# Patient Record
Sex: Female | Born: 1950
Health system: Southern US, Community
[De-identification: ages and names within clinical notes are randomized; demographics above are authoritative.]

## PROBLEM LIST (undated history)

## (undated) DIAGNOSIS — M1990 Unspecified osteoarthritis, unspecified site: Secondary | ICD-10-CM

## (undated) DIAGNOSIS — E039 Hypothyroidism, unspecified: Secondary | ICD-10-CM

## (undated) DIAGNOSIS — E119 Type 2 diabetes mellitus without complications: Secondary | ICD-10-CM

## (undated) HISTORY — DX: Type 2 diabetes mellitus without complications: E11.9

## (undated) HISTORY — DX: Hypothyroidism, unspecified: E03.9

## (undated) HISTORY — PX: WRIST SURGERY: SHX841

## (undated) HISTORY — PX: CHOLECYSTECTOMY: SHX55

---

## 2000-02-26 ENCOUNTER — Encounter: Payer: Self-pay | Admitting: Gastroenterology

## 2000-02-26 ENCOUNTER — Ambulatory Visit (HOSPITAL_COMMUNITY): Admission: RE | Admit: 2000-02-26 | Discharge: 2000-02-26 | Payer: Self-pay

## 2000-09-11 ENCOUNTER — Ambulatory Visit (HOSPITAL_COMMUNITY): Admission: RE | Admit: 2000-09-11 | Discharge: 2000-09-11 | Payer: Self-pay | Admitting: Gastroenterology

## 2000-10-24 ENCOUNTER — Encounter: Payer: Self-pay | Admitting: Family Medicine

## 2000-10-24 ENCOUNTER — Encounter: Admission: RE | Admit: 2000-10-24 | Discharge: 2000-10-24 | Payer: Self-pay | Admitting: Family Medicine

## 2008-06-18 ENCOUNTER — Encounter: Admission: RE | Admit: 2008-06-18 | Discharge: 2008-06-18 | Payer: Self-pay | Admitting: Neurology

## 2008-07-19 ENCOUNTER — Encounter: Admission: RE | Admit: 2008-07-19 | Discharge: 2008-08-23 | Payer: Self-pay | Admitting: Neurology

## 2008-08-31 ENCOUNTER — Ambulatory Visit: Payer: Self-pay | Admitting: Psychology

## 2008-08-31 ENCOUNTER — Encounter: Admission: RE | Admit: 2008-08-31 | Discharge: 2008-09-02 | Payer: Self-pay | Admitting: Neurology

## 2011-01-12 NOTE — Procedures (Signed)
[facility]  Patient:    Debra Burnett, Debra Burnett                       MRN: [id number] Proc. Date: 09/11/00 Adm. Date:  [id number] Disc. Date: [id number] Attending:  Louie Bun                           Procedure Report  PROCEDURE:  Flexible sigmoidoscopy.  INDICATION FOR PROCEDURE:  Screening for colon cancer in a 60 year old patient with no prior colon screening.  DESCRIPTION OF PROCEDURE:  The patient was placed in the left lateral decubitus position and placed on the pulse monitor with continuous low-flow oxygen delivered by nasal cannula.  She had been sedated with 60 mg IV Demerol and 6 mg IV Versed for the previous EGD, and no further sedation was required for this procedure.  The Olympus video colonoscope was inserted into the rectum and advanced to about 30-40 cm.  The prep was inconsistent, with the distal 20 cm being well-visualized.  There were areas between 20 and 40 cm that were coated with some adherent stool, and I could not rule out small lesions less than 1 cm in all areas.  Otherwise, the visualized areas of the descending as well as the sigmoid and rectum appeared normal down to the anus, where small internal hemorrhoids were seen.  The scope was then withdrawn and the patient returned to the recovery room in stable condition.  She tolerated the procedure well, and there were no immediate complications.  IMPRESSION:  Internal hemorrhoids, otherwise normal sigmoidoscopy. DD:  09/11/00 TD:  09/13/00 Job: [id number]

## 2016-03-22 DIAGNOSIS — M549 Dorsalgia, unspecified: Secondary | ICD-10-CM | POA: Diagnosis not present

## 2016-04-12 DIAGNOSIS — H40033 Anatomical narrow angle, bilateral: Secondary | ICD-10-CM | POA: Diagnosis not present

## 2016-04-12 DIAGNOSIS — E119 Type 2 diabetes mellitus without complications: Secondary | ICD-10-CM | POA: Diagnosis not present

## 2016-05-14 ENCOUNTER — Other Ambulatory Visit: Payer: Self-pay | Admitting: Physician Assistant

## 2016-05-14 DIAGNOSIS — E119 Type 2 diabetes mellitus without complications: Secondary | ICD-10-CM | POA: Diagnosis not present

## 2016-05-14 DIAGNOSIS — E038 Other specified hypothyroidism: Secondary | ICD-10-CM | POA: Diagnosis not present

## 2016-05-14 DIAGNOSIS — E039 Hypothyroidism, unspecified: Secondary | ICD-10-CM | POA: Diagnosis not present

## 2016-05-14 DIAGNOSIS — R0989 Other specified symptoms and signs involving the circulatory and respiratory systems: Secondary | ICD-10-CM | POA: Diagnosis not present

## 2016-05-14 DIAGNOSIS — E785 Hyperlipidemia, unspecified: Secondary | ICD-10-CM | POA: Diagnosis not present

## 2016-05-18 DIAGNOSIS — E039 Hypothyroidism, unspecified: Secondary | ICD-10-CM | POA: Diagnosis not present

## 2016-05-21 ENCOUNTER — Ambulatory Visit
Admission: RE | Admit: 2016-05-21 | Discharge: 2016-05-21 | Disposition: A | Discharge status: Home / Self Care | Payer: PPO | Source: Ambulatory Visit | Attending: Physician Assistant | Admitting: Physician Assistant

## 2016-05-21 DIAGNOSIS — R0989 Other specified symptoms and signs involving the circulatory and respiratory systems: Secondary | ICD-10-CM

## 2016-05-21 DIAGNOSIS — I6523 Occlusion and stenosis of bilateral carotid arteries: Secondary | ICD-10-CM | POA: Diagnosis not present

## 2016-07-31 DIAGNOSIS — Z23 Encounter for immunization: Secondary | ICD-10-CM | POA: Diagnosis not present

## 2016-07-31 DIAGNOSIS — E785 Hyperlipidemia, unspecified: Secondary | ICD-10-CM | POA: Diagnosis not present

## 2016-07-31 DIAGNOSIS — F419 Anxiety disorder, unspecified: Secondary | ICD-10-CM | POA: Diagnosis not present

## 2016-07-31 DIAGNOSIS — F17200 Nicotine dependence, unspecified, uncomplicated: Secondary | ICD-10-CM | POA: Diagnosis not present

## 2016-07-31 DIAGNOSIS — H6123 Impacted cerumen, bilateral: Secondary | ICD-10-CM | POA: Diagnosis not present

## 2016-07-31 DIAGNOSIS — E038 Other specified hypothyroidism: Secondary | ICD-10-CM | POA: Diagnosis not present

## 2016-07-31 DIAGNOSIS — E119 Type 2 diabetes mellitus without complications: Secondary | ICD-10-CM | POA: Diagnosis not present

## 2016-08-02 DIAGNOSIS — H938X2 Other specified disorders of left ear: Secondary | ICD-10-CM | POA: Diagnosis not present

## 2017-02-06 DIAGNOSIS — E039 Hypothyroidism, unspecified: Secondary | ICD-10-CM | POA: Diagnosis not present

## 2017-02-06 DIAGNOSIS — E7800 Pure hypercholesterolemia, unspecified: Secondary | ICD-10-CM | POA: Diagnosis not present

## 2017-02-06 DIAGNOSIS — F419 Anxiety disorder, unspecified: Secondary | ICD-10-CM | POA: Diagnosis not present

## 2017-02-06 DIAGNOSIS — E119 Type 2 diabetes mellitus without complications: Secondary | ICD-10-CM | POA: Diagnosis not present

## 2017-07-22 ENCOUNTER — Other Ambulatory Visit: Payer: Self-pay | Admitting: Physician Assistant

## 2017-07-22 DIAGNOSIS — R0989 Other specified symptoms and signs involving the circulatory and respiratory systems: Secondary | ICD-10-CM

## 2017-07-22 DIAGNOSIS — E039 Hypothyroidism, unspecified: Secondary | ICD-10-CM | POA: Diagnosis not present

## 2017-07-22 DIAGNOSIS — E7800 Pure hypercholesterolemia, unspecified: Secondary | ICD-10-CM | POA: Diagnosis not present

## 2017-07-22 DIAGNOSIS — E119 Type 2 diabetes mellitus without complications: Secondary | ICD-10-CM | POA: Diagnosis not present

## 2017-07-26 ENCOUNTER — Other Ambulatory Visit: Payer: PPO

## 2017-07-29 ENCOUNTER — Ambulatory Visit
Admission: RE | Admit: 2017-07-29 | Discharge: 2017-07-29 | Disposition: A | Discharge status: Home / Self Care | Payer: PPO | Source: Ambulatory Visit | Attending: Physician Assistant | Admitting: Physician Assistant

## 2017-07-29 DIAGNOSIS — I6523 Occlusion and stenosis of bilateral carotid arteries: Secondary | ICD-10-CM | POA: Diagnosis not present

## 2017-07-29 DIAGNOSIS — R0989 Other specified symptoms and signs involving the circulatory and respiratory systems: Secondary | ICD-10-CM

## 2018-03-10 DIAGNOSIS — F17200 Nicotine dependence, unspecified, uncomplicated: Secondary | ICD-10-CM | POA: Diagnosis not present

## 2018-03-10 DIAGNOSIS — E7800 Pure hypercholesterolemia, unspecified: Secondary | ICD-10-CM | POA: Diagnosis not present

## 2018-03-10 DIAGNOSIS — Z0000 Encounter for general adult medical examination without abnormal findings: Secondary | ICD-10-CM | POA: Diagnosis not present

## 2018-03-10 DIAGNOSIS — E039 Hypothyroidism, unspecified: Secondary | ICD-10-CM | POA: Diagnosis not present

## 2018-03-10 DIAGNOSIS — E119 Type 2 diabetes mellitus without complications: Secondary | ICD-10-CM | POA: Diagnosis not present

## 2018-03-10 DIAGNOSIS — F419 Anxiety disorder, unspecified: Secondary | ICD-10-CM | POA: Diagnosis not present

## 2018-09-19 DIAGNOSIS — F17200 Nicotine dependence, unspecified, uncomplicated: Secondary | ICD-10-CM | POA: Diagnosis not present

## 2018-09-19 DIAGNOSIS — E119 Type 2 diabetes mellitus without complications: Secondary | ICD-10-CM | POA: Diagnosis not present

## 2018-09-19 DIAGNOSIS — E039 Hypothyroidism, unspecified: Secondary | ICD-10-CM | POA: Diagnosis not present

## 2018-09-19 DIAGNOSIS — E7800 Pure hypercholesterolemia, unspecified: Secondary | ICD-10-CM | POA: Diagnosis not present

## 2019-05-18 DIAGNOSIS — E039 Hypothyroidism, unspecified: Secondary | ICD-10-CM | POA: Diagnosis not present

## 2019-05-18 DIAGNOSIS — E119 Type 2 diabetes mellitus without complications: Secondary | ICD-10-CM | POA: Diagnosis not present

## 2019-05-18 DIAGNOSIS — F419 Anxiety disorder, unspecified: Secondary | ICD-10-CM | POA: Diagnosis not present

## 2019-05-18 DIAGNOSIS — E7800 Pure hypercholesterolemia, unspecified: Secondary | ICD-10-CM | POA: Diagnosis not present

## 2019-05-18 DIAGNOSIS — Z0000 Encounter for general adult medical examination without abnormal findings: Secondary | ICD-10-CM | POA: Diagnosis not present

## 2019-11-23 ENCOUNTER — Other Ambulatory Visit (HOSPITAL_COMMUNITY): Payer: Self-pay | Admitting: Physician Assistant

## 2019-11-23 DIAGNOSIS — E7800 Pure hypercholesterolemia, unspecified: Secondary | ICD-10-CM | POA: Diagnosis not present

## 2019-11-23 DIAGNOSIS — F419 Anxiety disorder, unspecified: Secondary | ICD-10-CM | POA: Diagnosis not present

## 2019-11-23 DIAGNOSIS — E119 Type 2 diabetes mellitus without complications: Secondary | ICD-10-CM | POA: Diagnosis not present

## 2019-11-23 DIAGNOSIS — M8580 Other specified disorders of bone density and structure, unspecified site: Secondary | ICD-10-CM | POA: Diagnosis not present

## 2019-11-23 DIAGNOSIS — F17200 Nicotine dependence, unspecified, uncomplicated: Secondary | ICD-10-CM | POA: Diagnosis not present

## 2019-11-23 DIAGNOSIS — E039 Hypothyroidism, unspecified: Secondary | ICD-10-CM | POA: Diagnosis not present

## 2019-11-23 DIAGNOSIS — Z1231 Encounter for screening mammogram for malignant neoplasm of breast: Secondary | ICD-10-CM

## 2019-11-23 DIAGNOSIS — E2839 Other primary ovarian failure: Secondary | ICD-10-CM

## 2019-12-23 ENCOUNTER — Ambulatory Visit (HOSPITAL_COMMUNITY)
Admission: RE | Admit: 2019-12-23 | Discharge: 2019-12-23 | Disposition: A | Discharge status: Home / Self Care | Payer: PPO | Source: Ambulatory Visit | Attending: Physician Assistant | Admitting: Physician Assistant

## 2019-12-23 ENCOUNTER — Other Ambulatory Visit: Payer: Self-pay

## 2019-12-23 DIAGNOSIS — E2839 Other primary ovarian failure: Secondary | ICD-10-CM | POA: Insufficient documentation

## 2019-12-23 DIAGNOSIS — Z1231 Encounter for screening mammogram for malignant neoplasm of breast: Secondary | ICD-10-CM | POA: Diagnosis not present

## 2019-12-23 DIAGNOSIS — Z780 Asymptomatic menopausal state: Secondary | ICD-10-CM | POA: Diagnosis not present

## 2019-12-23 DIAGNOSIS — M85852 Other specified disorders of bone density and structure, left thigh: Secondary | ICD-10-CM | POA: Diagnosis not present

## 2019-12-24 ENCOUNTER — Other Ambulatory Visit (HOSPITAL_COMMUNITY): Payer: Self-pay | Admitting: Physician Assistant

## 2019-12-28 ENCOUNTER — Other Ambulatory Visit (HOSPITAL_COMMUNITY): Payer: Self-pay | Admitting: Physician Assistant

## 2019-12-28 DIAGNOSIS — R928 Other abnormal and inconclusive findings on diagnostic imaging of breast: Secondary | ICD-10-CM

## 2020-01-05 ENCOUNTER — Other Ambulatory Visit: Payer: Self-pay

## 2020-01-05 ENCOUNTER — Ambulatory Visit (HOSPITAL_COMMUNITY)
Admission: RE | Admit: 2020-01-05 | Discharge: 2020-01-05 | Disposition: A | Discharge status: Home / Self Care | Payer: PPO | Source: Ambulatory Visit | Attending: Physician Assistant | Admitting: Physician Assistant

## 2020-01-05 DIAGNOSIS — R928 Other abnormal and inconclusive findings on diagnostic imaging of breast: Secondary | ICD-10-CM | POA: Diagnosis not present

## 2020-01-05 DIAGNOSIS — R921 Mammographic calcification found on diagnostic imaging of breast: Secondary | ICD-10-CM | POA: Diagnosis not present

## 2020-01-05 DIAGNOSIS — N6489 Other specified disorders of breast: Secondary | ICD-10-CM | POA: Diagnosis not present

## 2020-01-21 ENCOUNTER — Other Ambulatory Visit: Payer: Self-pay | Admitting: Physician Assistant

## 2020-01-21 DIAGNOSIS — R921 Mammographic calcification found on diagnostic imaging of breast: Secondary | ICD-10-CM

## 2020-02-01 ENCOUNTER — Ambulatory Visit
Admission: RE | Admit: 2020-02-01 | Discharge: 2020-02-01 | Disposition: A | Discharge status: Home / Self Care | Payer: PPO | Source: Ambulatory Visit | Attending: Physician Assistant | Admitting: Physician Assistant

## 2020-02-01 ENCOUNTER — Other Ambulatory Visit: Payer: Self-pay

## 2020-02-01 ENCOUNTER — Inpatient Hospital Stay: Admission: RE | Admit: 2020-02-01 | Payer: PPO | Source: Ambulatory Visit

## 2020-02-01 DIAGNOSIS — R921 Mammographic calcification found on diagnostic imaging of breast: Secondary | ICD-10-CM

## 2020-02-01 DIAGNOSIS — N6011 Diffuse cystic mastopathy of right breast: Secondary | ICD-10-CM | POA: Diagnosis not present

## 2020-02-25 DIAGNOSIS — Z0184 Encounter for antibody response examination: Secondary | ICD-10-CM | POA: Diagnosis not present

## 2020-06-10 DIAGNOSIS — Z0000 Encounter for general adult medical examination without abnormal findings: Secondary | ICD-10-CM | POA: Diagnosis not present

## 2020-06-10 DIAGNOSIS — F5102 Adjustment insomnia: Secondary | ICD-10-CM | POA: Diagnosis not present

## 2020-06-10 DIAGNOSIS — Z23 Encounter for immunization: Secondary | ICD-10-CM | POA: Diagnosis not present

## 2020-06-10 DIAGNOSIS — E7800 Pure hypercholesterolemia, unspecified: Secondary | ICD-10-CM | POA: Diagnosis not present

## 2020-06-10 DIAGNOSIS — Z1159 Encounter for screening for other viral diseases: Secondary | ICD-10-CM | POA: Diagnosis not present

## 2020-06-10 DIAGNOSIS — E119 Type 2 diabetes mellitus without complications: Secondary | ICD-10-CM | POA: Diagnosis not present

## 2020-06-10 DIAGNOSIS — Z7984 Long term (current) use of oral hypoglycemic drugs: Secondary | ICD-10-CM | POA: Diagnosis not present

## 2020-06-10 DIAGNOSIS — E039 Hypothyroidism, unspecified: Secondary | ICD-10-CM | POA: Diagnosis not present

## 2020-06-27 DIAGNOSIS — Z1211 Encounter for screening for malignant neoplasm of colon: Secondary | ICD-10-CM | POA: Diagnosis not present

## 2020-06-27 DIAGNOSIS — Z1212 Encounter for screening for malignant neoplasm of rectum: Secondary | ICD-10-CM | POA: Diagnosis not present

## 2020-07-06 LAB — COLOGUARD: COLOGUARD: POSITIVE — AB

## 2021-02-20 DIAGNOSIS — F5102 Adjustment insomnia: Secondary | ICD-10-CM | POA: Diagnosis not present

## 2021-02-20 DIAGNOSIS — E119 Type 2 diabetes mellitus without complications: Secondary | ICD-10-CM | POA: Diagnosis not present

## 2021-02-20 DIAGNOSIS — F419 Anxiety disorder, unspecified: Secondary | ICD-10-CM | POA: Diagnosis not present

## 2021-02-20 DIAGNOSIS — E039 Hypothyroidism, unspecified: Secondary | ICD-10-CM | POA: Diagnosis not present

## 2021-02-20 DIAGNOSIS — R195 Other fecal abnormalities: Secondary | ICD-10-CM | POA: Diagnosis not present

## 2021-02-20 DIAGNOSIS — E7800 Pure hypercholesterolemia, unspecified: Secondary | ICD-10-CM | POA: Diagnosis not present

## 2021-02-20 DIAGNOSIS — Z23 Encounter for immunization: Secondary | ICD-10-CM | POA: Diagnosis not present

## 2021-07-04 ENCOUNTER — Encounter: Payer: Self-pay | Admitting: Internal Medicine

## 2021-08-07 ENCOUNTER — Other Ambulatory Visit: Payer: Self-pay | Admitting: Physician Assistant

## 2021-08-07 DIAGNOSIS — E039 Hypothyroidism, unspecified: Secondary | ICD-10-CM | POA: Diagnosis not present

## 2021-08-07 DIAGNOSIS — E7800 Pure hypercholesterolemia, unspecified: Secondary | ICD-10-CM | POA: Diagnosis not present

## 2021-08-07 DIAGNOSIS — E119 Type 2 diabetes mellitus without complications: Secondary | ICD-10-CM | POA: Diagnosis not present

## 2021-08-07 DIAGNOSIS — Z1231 Encounter for screening mammogram for malignant neoplasm of breast: Secondary | ICD-10-CM

## 2021-08-07 DIAGNOSIS — F419 Anxiety disorder, unspecified: Secondary | ICD-10-CM | POA: Diagnosis not present

## 2021-08-07 DIAGNOSIS — Z0000 Encounter for general adult medical examination without abnormal findings: Secondary | ICD-10-CM | POA: Diagnosis not present

## 2021-08-07 DIAGNOSIS — Z23 Encounter for immunization: Secondary | ICD-10-CM | POA: Diagnosis not present

## 2021-09-19 DIAGNOSIS — E039 Hypothyroidism, unspecified: Secondary | ICD-10-CM | POA: Diagnosis not present

## 2021-10-24 DIAGNOSIS — Z1231 Encounter for screening mammogram for malignant neoplasm of breast: Secondary | ICD-10-CM | POA: Diagnosis not present

## 2021-10-25 ENCOUNTER — Inpatient Hospital Stay: Admission: RE | Admit: 2021-10-25 | Payer: PPO | Source: Ambulatory Visit

## 2021-10-30 DIAGNOSIS — E039 Hypothyroidism, unspecified: Secondary | ICD-10-CM | POA: Diagnosis not present

## 2021-11-08 IMAGING — MG MM DIGITAL DIAGNOSTIC UNILAT*R* W/ TOMO W/ CAD
6 series · 6 of 14 positions shown · non-contrast
Comparison: Previous exam(s).

CLINICAL DATA: 69-year-old female presenting as a recall from
baseline screening for possible right breast asymmetry and
calcifications.

EXAM:
DIGITAL DIAGNOSTIC RIGHT MAMMOGRAM WITH TOMO
ULTRASOUND RIGHT BREAST

[R ML]
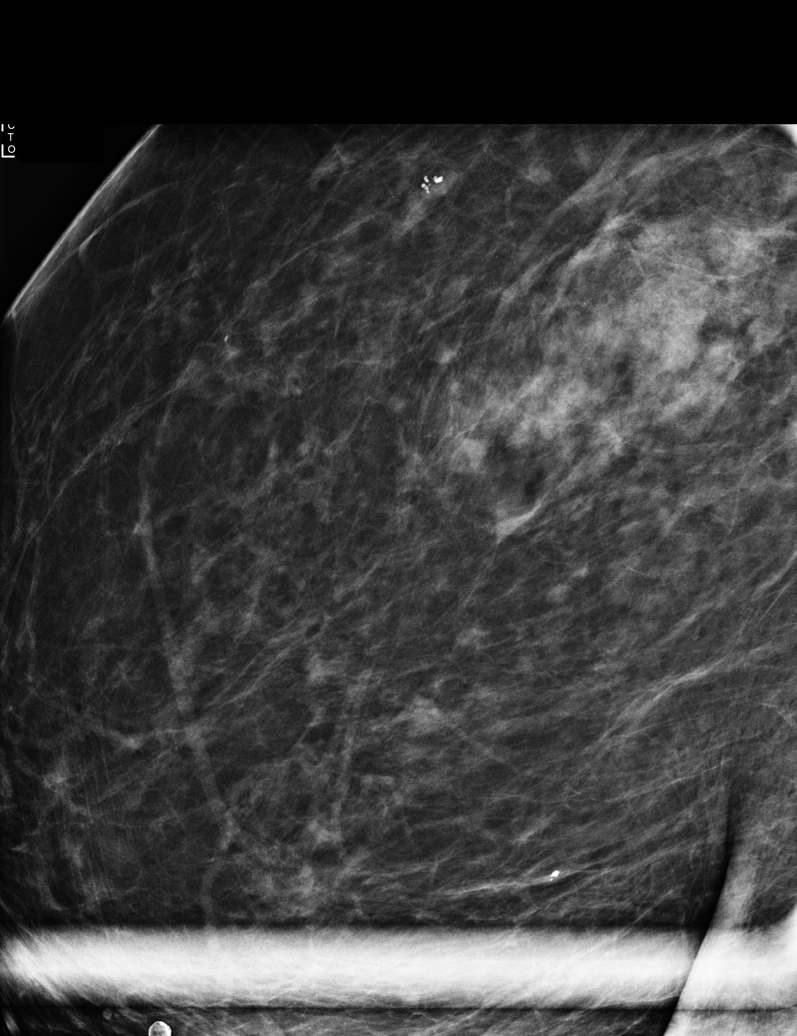

[R CC]
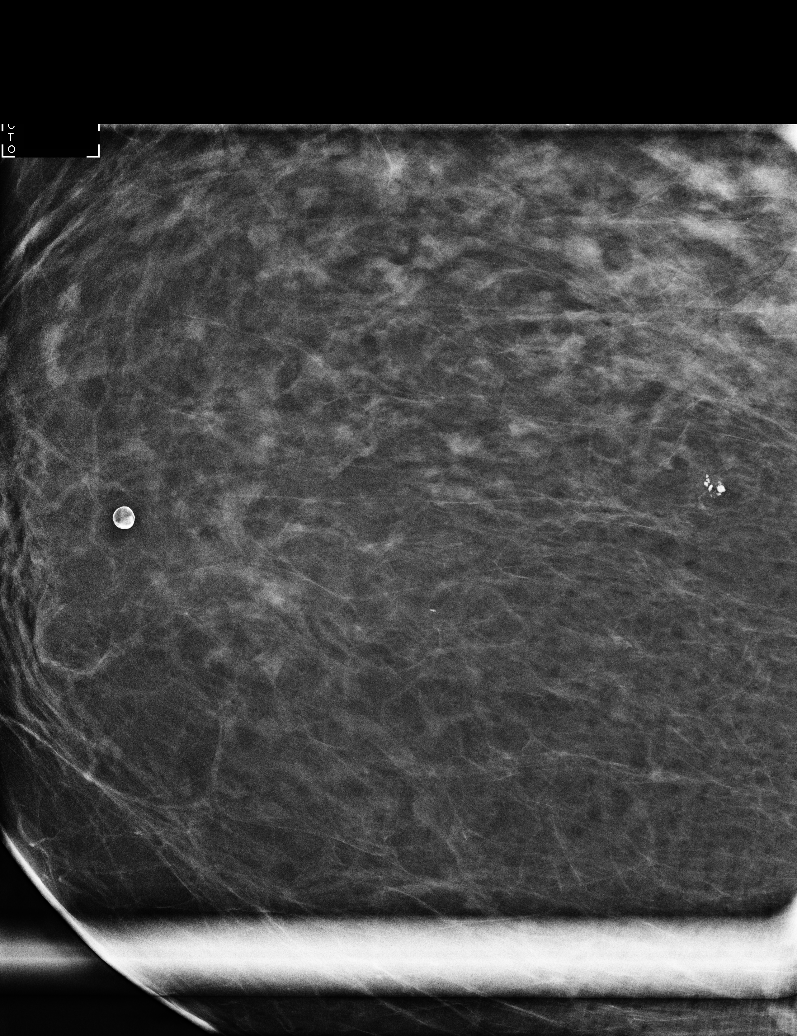

[R MLO synth-2D]
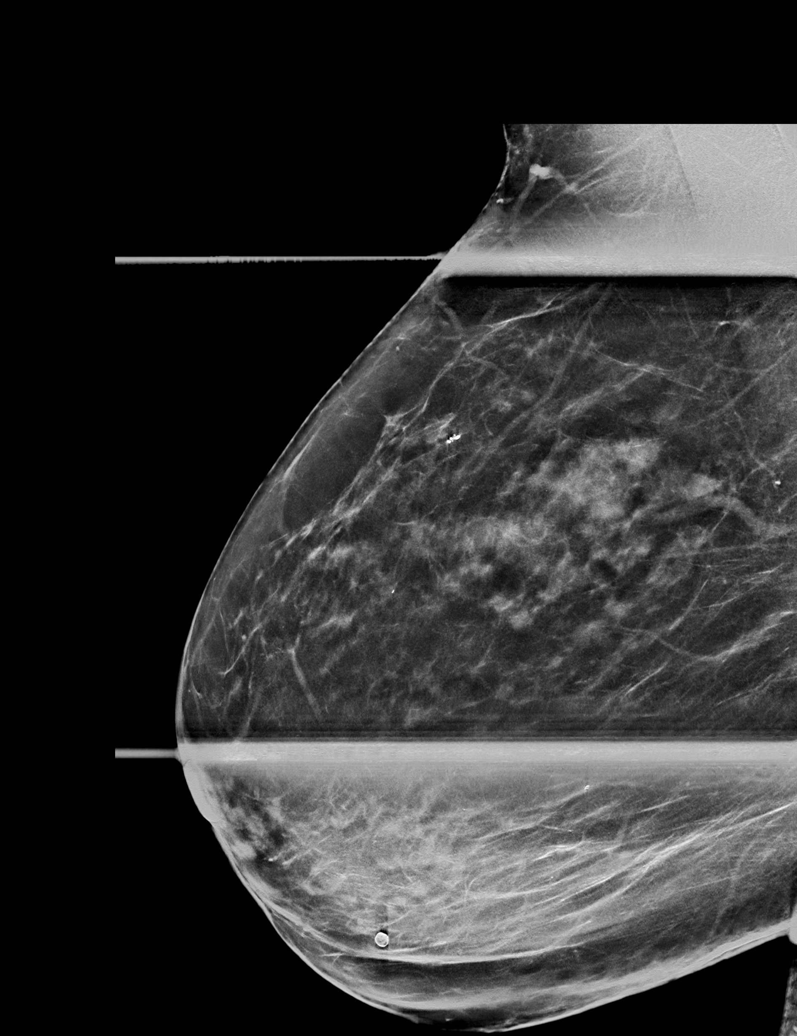

[R CC synth-2D]
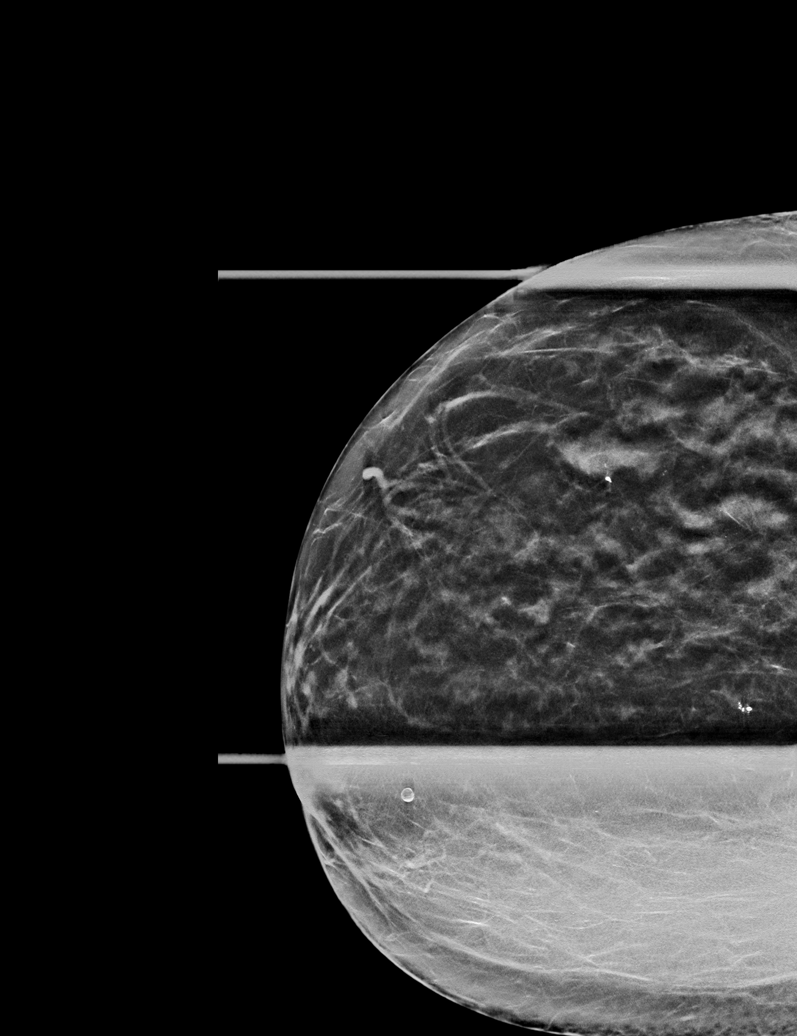

[R MLO tomo · tomo slice 29/57.0]
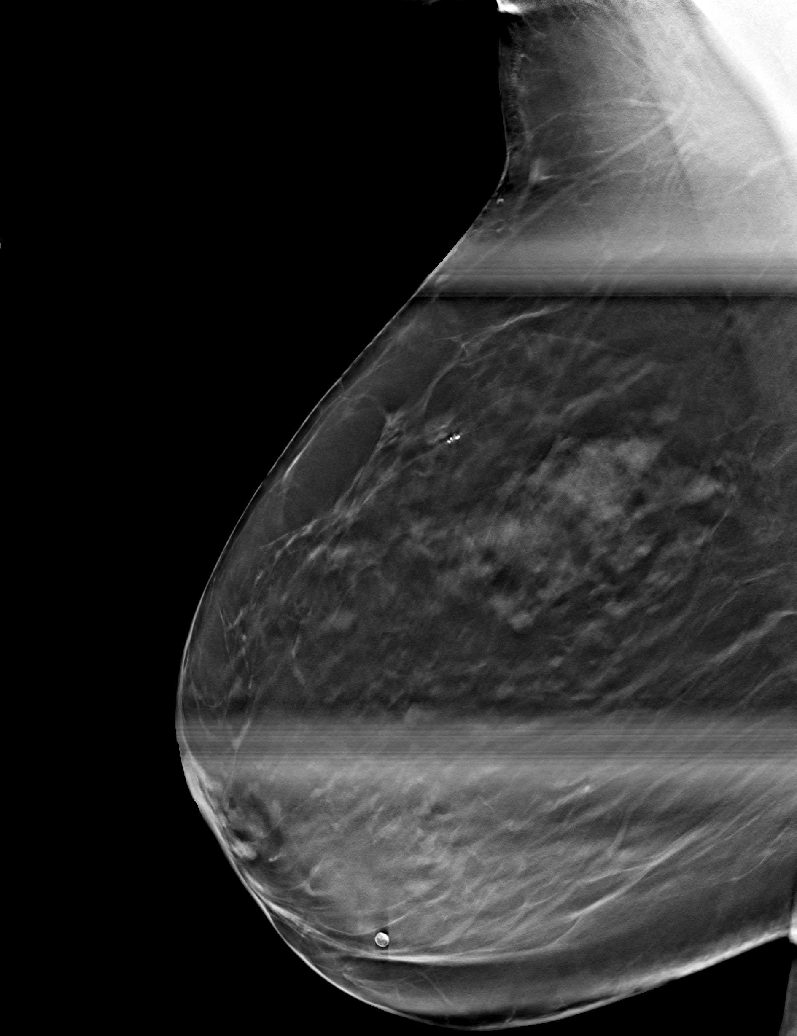

[R CC tomo · tomo slice 24/47.0]
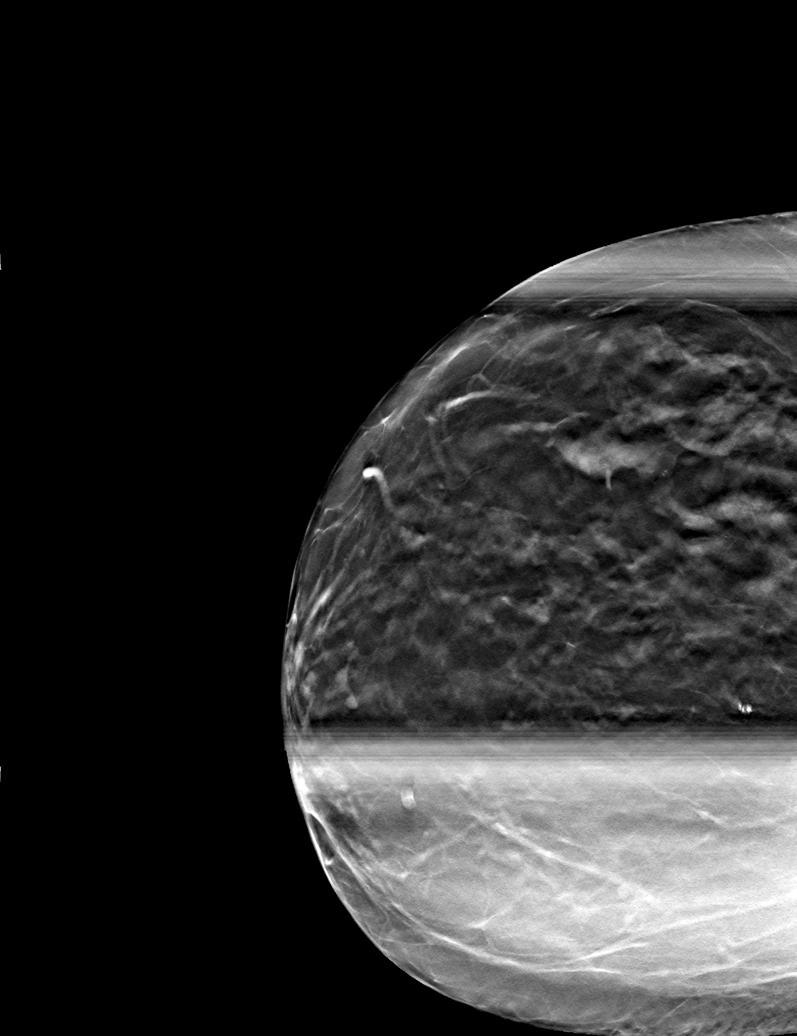

[6 of 14 positions shown; findings below may reference images not displayed]

ACR Breast Density Category c: The breast tissue is heterogeneously
dense, which may obscure small masses.
FINDINGS: Mammogram:

Spot magnification 2D and spot compression tomosynthesis views of
the right breast were performed. There is a persistent regional
asymmetry in the upper-outer quadrant of the right breast without a
discrete suspicious mass on the additional views. The appearance
mammographically favors overlapping normal fibroglandular tissue.
There is a group of coarse heterogeneous calcifications spanning 3
mm in the superior central right breast.

Ultrasound:

Targeted ultrasound is performed throughout the upper-outer quadrant
the right breast demonstrating no suspicious cystic or solid mass or
other finding to correspond to the asymmetry seen mammographically.
IMPRESSION: 1. Grouped coarse heterogeneous calcifications spanning 3 mm in the
superior right breast are indeterminate.

2. The regional asymmetry in the upper outer right breast is
consistent with benign dense fibroglandular tissue.

RECOMMENDATION:
Stereotactic core needle biopsy of the right breast calcifications.

The patient will be contacted to arrange for the biopsy appointment
in [HOSPITAL].

BI-RADS CATEGORY  4: Suspicious.

## 2021-11-14 NOTE — H&P (View-Only) (Signed)
? ? ?GI Office Note   ? ?Referring Provider: Williams, Breejante J, * ?Primary Care Physician:  Williams, Breejante J, PA-C  ? ? ?Chief Complaint  ? ?Chief Complaint  ?Patient presents with  ? Constipation  ?  Positive cologuard   ? ? ? ?History of Present Illness  ? ?Debra Burnett is a 71 y.o. female with history of hypothyroidism and diabetes presenting today at the request of her PCP for need for colonoscopy due to positive Cologuard. ? ?Last colonoscopy 2002 - internal hemorrhoids, otherwise normal. Per Care everywhere - reported colonoscopy in October 2011 but not reports or information available ? ? ?Today: ?She reports a recent positive Cologuard test. She has had multiple colonoscopies in the past but is not clear on dates and feels like they were normal. She does report some toilet tissue hematochezia secondary to hemorrhoids and intermittent constipation. Her bowel habits are that she usually goes everyday unless she skips her fiber in her diet. She drinks a fair amount of decaf coffee and only has about 3 small glasses of water a day. Stool consistency usually Bristol 2 or 3, denies straining or incomplete emptying. She reports he has tried metamucil which is somewhat helpful. Denies melena, dysphagia, N/V, abdominal pain, unexplained weight loss, reflux. She is unaware of family history of colon cancer or colon polyps. ? ?HOH - needs hearing aids, states she is going to follow up with her PCP about this.  ? ?Takes valium once a week for insomnia. ? ?Past Medical History:  ?Diagnosis Date  ? Diabetes type 2, controlled (HCC)   ? Hypothyroid   ? ? ?Past Surgical History:  ?Procedure Laterality Date  ? CHOLECYSTECTOMY    ? 1970s  ? ? ?Current Outpatient Medications  ?Medication Sig Dispense Refill  ? atorvastatin (LIPITOR) 40 MG tablet Take 40 mg by mouth daily.    ? Coenzyme Q10 100 MG capsule Take by mouth.    ? Cyanocobalamin (VITAMIN B 12) 100 MCG LOZG Take by mouth.    ? levothyroxine (SYNTHROID)  50 MCG tablet Take by mouth.    ? liothyronine (CYTOMEL) 5 MCG tablet Take 5 mcg by mouth daily.    ? Melatonin 5 MG CAPS Take by mouth.    ? metFORMIN (GLUCOPHAGE) 500 MG tablet Take 1 tablet by mouth daily.    ? metFORMIN (GLUCOPHAGE) 500 MG tablet Take 500 mg by mouth daily.    ? MODERNA COVID-19 BIVAL BOOSTER 50 MCG/0.5ML injection     ? Multiple Vitamin (MULTI-VITAMIN) tablet Take by mouth.    ? diazepam (VALIUM) 5 MG tablet Take 2.5-5 mg by mouth every 6 (six) hours as needed.    ? ?No current facility-administered medications for this visit.  ? ? ?Allergies as of 11/15/2021  ? (Not on File)  ? ? ?Family History  ?Problem Relation Age of Onset  ? Heart disease Father   ? Diverticulitis Father   ? Cancer - Colon Neg Hx   ? Colon polyps Neg Hx   ? ? ?Social History  ? ?Socioeconomic History  ? Marital status: Single  ?  Spouse name: Not on file  ? Number of children: Not on file  ? Years of education: Not on file  ? Highest education level: Not on file  ?Occupational History  ? Not on file  ?Tobacco Use  ? Smoking status: Every Day  ?  Packs/day: 0.50  ?  Years: 20.00  ?  Pack years: 10.00  ?  Types:   Cigarettes  ? Smokeless tobacco: Not on file  ?Substance and Sexual Activity  ? Alcohol use: Never  ? Drug use: Never  ? Sexual activity: Never  ?Other Topics Concern  ? Not on file  ?Social History Narrative  ? Not on file  ? ?Social Determinants of Health  ? ?Financial Resource Strain: Not on file  ?Food Insecurity: Not on file  ?Transportation Needs: Not on file  ?Physical Activity: Not on file  ?Stress: Not on file  ?Social Connections: Not on file  ?Intimate Partner Violence: Not on file  ? ? ? ?Review of Systems  ? ?Gen: Denies any fever, chills, fatigue, weight loss, lack of appetite.  ?CV: Denies chest pain, heart palpitations, peripheral edema, syncope.  ?Resp: Denies shortness of breath at rest or with exertion. Denies wheezing or cough.  ?GI: see HPI ?GU : Denies urinary burning, urinary frequency,  urinary hesitancy ?MS: Denies joint pain, muscle weakness, cramps, or limitation of movement.  ?Derm: Denies rash, itching, dry skin ?Psych: Denies depression, anxiety, memory loss, and confusion ?Heme: Denies bruising, bleeding, and enlarged lymph nodes. ? ? ?Physical Exam  ? ?BP (!) 142/68   Pulse 86   Temp 97.6 ?F (36.4 ?C) (Temporal)   Ht 5' (1.524 m)   Wt 130 lb 6.4 oz (59.1 kg)   BMI 25.47 kg/m?  ? ?General:   Alert and oriented. Pleasant and cooperative. Well-nourished and well-developed.  ?Head:  Normocephalic and atraumatic. ?Eyes:  Without icterus, sclera clear and conjunctiva pink.  ?Ears:  HOH in her right ear, malfunctioning left hearing aid ?Mouth:  No deformity or lesions, oral mucosa pink.  ?Lungs:  Clear to auscultation bilaterally. No wheezes, rales, or rhonchi. No distress.  ?Heart:  S1, S2 present without murmurs appreciated.  ?Abdomen:  +BS, soft, non-tender and non-distended. No HSM noted. No guarding or rebound. No masses appreciated.  ?Rectal:  Deferred  ?Msk:  Symmetrical without gross deformities. Normal posture. ?Extremities:  Without edema. ?Neurologic:  Alert and  oriented x4;  grossly normal neurologically. ?Skin:  Intact without significant lesions or rashes. ?Psych:  Alert and cooperative. Normal mood and affect. ? ? ?Assessment  ? ?Debra Burnett is a 71 y.o. female with history of diabetes and hypothyroid presenting today with constipation and need for colon cancer screening due to positive Cologuard. ? ?Constipation: Complains of hard stools (Bristol 2/3) more frequently. Trying to eat more fiber in her diet and is worsened when she does not. Recommend continuing fiber rich foods and switching from metamucil to benefiber and the addition of miralax 1 capful daily. Advised to call with progress report. Will give Linzess sample if needed 4 days prior to prep if needed.  ? ?Hemorrhoids: Some intermittent toilet tissue hematochezia. Likely will improve as constipation improves.  Can consider anusol cream or possible hemorrhoid banding in the future if unresolved with management of constipation.  ? ?Positive Cologuard/Need for screening for colon cancer: Per patient, history of negative screening colonoscopies. Recently opted for Cologuard testing which came back positive. She is not having any alarm symptoms. She does have a history of bleeding hemorrhoids as this could possibly affect Cologuard results. Will schedule for screening colonoscopy with Dr. Jena Gauss in the near future. ? ? ?PLAN  ? ?Proceed with colonoscopy by Dr. Jena Gauss in near future: the risks, benefits, and alternatives have been discussed with the patient in detail. The patient states understanding and desires to proceed. ASA 2 ?Benefiber 2 teaspoons daily ?Miralax 1 capful daily.  ?Consider  Linzess sample prior to colonoscopy if constipation continues ?Hold metformin morning of procedure ?Make office visit for 2 months after TCS complete ?PCP follow up for ENT referral given difficulty hearing ? ? ?Brooke Bonito, MSN, FNP-BC, AGACNP-BC ?[facility] ? ?

## 2021-11-14 NOTE — Progress Notes (Signed)
? ? ?GI Office Note   ? ?Referring Provider: Roger KillWilliams, Breejante J, * ?Primary Care Physician:  Roger KillWilliams, Breejante J, PA-C  ? ? ?Chief Complaint  ? ?Chief Complaint  ?Patient presents with  ? Constipation  ?  Positive cologuard   ? ? ? ?History of Present Illness  ? ?Debra Burnett is a 71 y.o. female with history of hypothyroidism and diabetes presenting today at the request of her PCP for need for colonoscopy due to positive Cologuard. ? ?Last colonoscopy 2002 - internal hemorrhoids, otherwise normal. Per Care everywhere - reported colonoscopy in October 2011 but not reports or information available ? ? ?Today: ?She reports a recent positive Cologuard test. She has had multiple colonoscopies in the past but is not clear on dates and feels like they were normal. She does report some toilet tissue hematochezia secondary to hemorrhoids and intermittent constipation. Her bowel habits are that she usually goes everyday unless she skips her fiber in her diet. She drinks a fair amount of decaf coffee and only has about 3 small glasses of water a day. Stool consistency usually Bristol 2 or 3, denies straining or incomplete emptying. She reports he has tried metamucil which is somewhat helpful. Denies melena, dysphagia, N/V, abdominal pain, unexplained weight loss, reflux. She is unaware of family history of colon cancer or colon polyps. ? ?HOH - needs hearing aids, states she is going to follow up with her PCP about this.  ? ?Takes valium once a week for insomnia. ? ?Past Medical History:  ?Diagnosis Date  ? Diabetes type 2, controlled (HCC)   ? Hypothyroid   ? ? ?Past Surgical History:  ?Procedure Laterality Date  ? CHOLECYSTECTOMY    ? 1970s  ? ? ?Current Outpatient Medications  ?Medication Sig Dispense Refill  ? atorvastatin (LIPITOR) 40 MG tablet Take 40 mg by mouth daily.    ? Coenzyme Q10 100 MG capsule Take by mouth.    ? Cyanocobalamin (VITAMIN B 12) 100 MCG LOZG Take by mouth.    ? levothyroxine (SYNTHROID)  50 MCG tablet Take by mouth.    ? liothyronine (CYTOMEL) 5 MCG tablet Take 5 mcg by mouth daily.    ? Melatonin 5 MG CAPS Take by mouth.    ? metFORMIN (GLUCOPHAGE) 500 MG tablet Take 1 tablet by mouth daily.    ? metFORMIN (GLUCOPHAGE) 500 MG tablet Take 500 mg by mouth daily.    ? MODERNA COVID-19 BIVAL BOOSTER 50 MCG/0.5ML injection     ? Multiple Vitamin (MULTI-VITAMIN) tablet Take by mouth.    ? diazepam (VALIUM) 5 MG tablet Take 2.5-5 mg by mouth every 6 (six) hours as needed.    ? ?No current facility-administered medications for this visit.  ? ? ?Allergies as of 11/15/2021  ? (Not on File)  ? ? ?Family History  ?Problem Relation Age of Onset  ? Heart disease Father   ? Diverticulitis Father   ? Cancer - Colon Neg Hx   ? Colon polyps Neg Hx   ? ? ?Social History  ? ?Socioeconomic History  ? Marital status: Single  ?  Spouse name: Not on file  ? Number of children: Not on file  ? Years of education: Not on file  ? Highest education level: Not on file  ?Occupational History  ? Not on file  ?Tobacco Use  ? Smoking status: Every Day  ?  Packs/day: 0.50  ?  Years: 20.00  ?  Pack years: 10.00  ?  Types:  Cigarettes  ? Smokeless tobacco: Not on file  ?Substance and Sexual Activity  ? Alcohol use: Never  ? Drug use: Never  ? Sexual activity: Never  ?Other Topics Concern  ? Not on file  ?Social History Narrative  ? Not on file  ? ?Social Determinants of Health  ? ?Financial Resource Strain: Not on file  ?Food Insecurity: Not on file  ?Transportation Needs: Not on file  ?Physical Activity: Not on file  ?Stress: Not on file  ?Social Connections: Not on file  ?Intimate Partner Violence: Not on file  ? ? ? ?Review of Systems  ? ?Gen: Denies any fever, chills, fatigue, weight loss, lack of appetite.  ?CV: Denies chest pain, heart palpitations, peripheral edema, syncope.  ?Resp: Denies shortness of breath at rest or with exertion. Denies wheezing or cough.  ?GI: see HPI ?GU : Denies urinary burning, urinary frequency,  urinary hesitancy ?MS: Denies joint pain, muscle weakness, cramps, or limitation of movement.  ?Derm: Denies rash, itching, dry skin ?Psych: Denies depression, anxiety, memory loss, and confusion ?Heme: Denies bruising, bleeding, and enlarged lymph nodes. ? ? ?Physical Exam  ? ?BP (!) 142/68   Pulse 86   Temp 97.6 ?F (36.4 ?C) (Temporal)   Ht 5' (1.524 m)   Wt 130 lb 6.4 oz (59.1 kg)   BMI 25.47 kg/m?  ? ?General:   Alert and oriented. Pleasant and cooperative. Well-nourished and well-developed.  ?Head:  Normocephalic and atraumatic. ?Eyes:  Without icterus, sclera clear and conjunctiva pink.  ?Ears:  HOH in her right ear, malfunctioning left hearing aid ?Mouth:  No deformity or lesions, oral mucosa pink.  ?Lungs:  Clear to auscultation bilaterally. No wheezes, rales, or rhonchi. No distress.  ?Heart:  S1, S2 present without murmurs appreciated.  ?Abdomen:  +BS, soft, non-tender and non-distended. No HSM noted. No guarding or rebound. No masses appreciated.  ?Rectal:  Deferred  ?Msk:  Symmetrical without gross deformities. Normal posture. ?Extremities:  Without edema. ?Neurologic:  Alert and  oriented x4;  grossly normal neurologically. ?Skin:  Intact without significant lesions or rashes. ?Psych:  Alert and cooperative. Normal mood and affect. ? ? ?Assessment  ? ?[name] is a 71 y.o. female with history of diabetes and hypothyroid presenting today with constipation and need for colon cancer screening due to positive Cologuard. ? ?Constipation: Complains of hard stools (Bristol 2/3) more frequently. Trying to eat more fiber in her diet and is worsened when she does not. Recommend continuing fiber rich foods and switching from metamucil to benefiber and the addition of miralax 1 capful daily. Advised to call with progress report. Will give Linzess sample if needed 4 days prior to prep if needed.  ? ?Hemorrhoids: Some intermittent toilet tissue hematochezia. Likely will improve as constipation improves.  Can consider anusol cream or possible hemorrhoid banding in the future if unresolved with management of constipation.  ? ?Positive Cologuard/Need for screening for colon cancer: Per patient, history of negative screening colonoscopies. Recently opted for Cologuard testing which came back positive. She is not having any alarm symptoms. She does have a history of bleeding hemorrhoids as this could possibly affect Cologuard results. Will schedule for screening colonoscopy with Dr. Jena Gauss in the near future. ? ? ?PLAN  ? ?Proceed with colonoscopy by Dr. Jena Gauss in near future: the risks, benefits, and alternatives have been discussed with the patient in detail. The patient states understanding and desires to proceed. ASA 2 ?Benefiber 2 teaspoons daily ?Miralax 1 capful daily.  ?Consider  Linzess sample prior to colonoscopy if constipation continues ?Hold metformin morning of procedure ?Make office visit for 2 months after TCS complete ?PCP follow up for ENT referral given difficulty hearing ? ? ?Brooke Bonito, MSN, FNP-BC, AGACNP-BC ?[facility] ? ?

## 2021-11-15 ENCOUNTER — Other Ambulatory Visit: Payer: Self-pay

## 2021-11-15 ENCOUNTER — Telehealth: Payer: Self-pay | Admitting: *Deleted

## 2021-11-15 ENCOUNTER — Ambulatory Visit: Payer: Medicare HMO | Admitting: Gastroenterology

## 2021-11-15 ENCOUNTER — Encounter: Payer: Self-pay | Admitting: Gastroenterology

## 2021-11-15 VITALS — BP 142/68 | HR 86 | Temp 97.6°F | Ht 60.0 in | Wt 130.4 lb

## 2021-11-15 DIAGNOSIS — K5900 Constipation, unspecified: Secondary | ICD-10-CM | POA: Diagnosis not present

## 2021-11-15 DIAGNOSIS — Z1211 Encounter for screening for malignant neoplasm of colon: Secondary | ICD-10-CM

## 2021-11-15 DIAGNOSIS — R195 Other fecal abnormalities: Secondary | ICD-10-CM | POA: Diagnosis not present

## 2021-11-15 DIAGNOSIS — K649 Unspecified hemorrhoids: Secondary | ICD-10-CM

## 2021-11-15 DIAGNOSIS — E119 Type 2 diabetes mellitus without complications: Secondary | ICD-10-CM | POA: Insufficient documentation

## 2021-11-15 DIAGNOSIS — E039 Hypothyroidism, unspecified: Secondary | ICD-10-CM | POA: Insufficient documentation

## 2021-11-15 MED ORDER — PEG 3350-KCL-NA BICARB-NACL 420 G PO SOLR: ORAL | 0 refills | Status: AC

## 2021-11-15 NOTE — Telephone Encounter (Signed)
LMOVM to call back to schedule TCS with Dr. Rourk, ASA 2 

## 2021-11-15 NOTE — Patient Instructions (Signed)
It was a pleasure to meet you today. As discussed we are scheduling you for a colonoscopy in the near future with Dr. Jena Gauss for colon cancer screening.  ? ?For constipation I want you to begin taking benefiber 2 teaspoons daily and add miralax 1 capful daily. If you begin to have more than 2-3 loose stools a day you can take it every other day. Let me know in a few weeks if this improves or not.  ? ?I am happy to give you a sample of linzess to start taking a few days prior to your procedure if you do not have improvement with the miralax.  ? ?Hold metformin the morning of the procedure.  ? ?It was a pleasure to see you today. I want to create trusting relationships with patients. If you receive a survey regarding your visit,  I greatly appreciate you taking time to fill this out on paper or through your MyChart. I value your feedback. ? ?Brooke Bonito, MSN, FNP-BC, AGACNP-BC ?[facility] ? ? ?

## 2021-11-15 NOTE — Telephone Encounter (Signed)
Pt returned call. She has been scheduled for 4/10 at 9:45am. Aware will mail prep instructions and will send rx to pharmacy ? ? ?PA approved via cohere. Auth# [id number], DOS: 12/04/2021 - 03/04/2022 ?

## 2021-11-28 NOTE — Patient Instructions (Signed)
? ? ? ? ? Debra Burnett ? 11/28/2021  ?  ? @PREFPERIOPPHARMACY @ ? ? Your procedure is scheduled on 12/04/2021. ? ? Report to [facility] at  [place] A.M. ? ? Call this number if you have problems the morning of surgery: ? [phone] ? ? Remember: ? Follow the diet and prep instructions given to you by the office. ?  ? Take these medicines the morning of surgery with A SIP OF WATER  ? ?levothyroxine, cytomel. ?  ? Do not wear jewelry, make-up or nail polish. ? Do not wear lotions, powders, or perfumes, or deodorant. ? Do not shave 48 hours prior to surgery.  Men may shave face and neck. ? Do not bring valuables to the hospital. ? [facility] is not responsible for any belongings or valuables. ? ?Contacts, dentures or bridgework may not be worn into surgery.  Leave your suitcase in the car.  After surgery it may be brought to your room. ? ?For patients admitted to the hospital, discharge time will be determined by your treatment team. ? ?Patients discharged the day of surgery will not be allowed to drive home and must have someone with them for 24 hours.  ? ? ?Special instructions:   DO NOT smoke tobacco or vape for 24 hours before your procedure. ? ?Please read over the following fact sheets that you were given. ?Anesthesia Post-op Instructions and Care and Recovery After Surgery ?  ? ? ? Colonoscopy, Adult, Care After ?This sheet gives you information about how to care for yourself after your procedure. Your health care provider may also give you more specific instructions. If you have problems or questions, contact your health care provider. ?What can I expect after the procedure? ?After the procedure, it is common to have: ?A small amount of blood in your stool for 24 hours after the procedure. ?Some gas. ?Mild cramping or bloating of your abdomen. ?Follow these instructions at home: ?Eating and drinking ? ?Drink enough fluid to keep your urine pale yellow. ?Follow instructions from your health care provider about  eating or drinking restrictions. ?Resume your normal diet as instructed by your health care provider. Avoid heavy or fried foods that are hard to digest. ?Activity ?Rest as told by your health care provider. ?Avoid sitting for a long time without moving. Get up to take short walks every 1-2 hours. This is important to improve blood flow and breathing. Ask for help if you feel weak or unsteady. ?Return to your normal activities as told by your health care provider. Ask your health care provider what activities are safe for you. ?Managing cramping and bloating ? ?Try walking around when you have cramps or feel bloated. ?Apply heat to your abdomen as told by your health care provider. Use the heat source that your health care provider recommends, such as a moist heat pack or a heating pad. ?Place a towel between your skin and the heat source. ?Leave the heat on for 20-30 minutes. ?Remove the heat if your skin turns bright red. This is especially important if you are unable to feel pain, heat, or cold. You may have a greater risk of getting burned. ?General instructions ?If you were given a sedative during the procedure, it can affect you for several hours. Do not drive or operate machinery until your health care provider says that it is safe. ?For the first 24 hours after the procedure: ?Do not sign important documents. ?Do not drink alcohol. ?Do your regular daily activities  at a slower pace than normal. ?Eat soft foods that are easy to digest. ?Take over-the-counter and prescription medicines only as told by your health care provider. ?Keep all follow-up visits as told by your health care provider. This is important. ?Contact a health care provider if: ?You have blood in your stool 2-3 days after the procedure. ?Get help right away if you have: ?More than a small spotting of blood in your stool. ?Large blood clots in your stool. ?Swelling of your abdomen. ?Nausea or vomiting. ?A fever. ?Increasing pain in your  abdomen that is not relieved with medicine. ?Summary ?After the procedure, it is common to have a small amount of blood in your stool. You may also have mild cramping and bloating of your abdomen. ?If you were given a sedative during the procedure, it can affect you for several hours. Do not drive or operate machinery until your health care provider says that it is safe. ?Get help right away if you have a lot of blood in your stool, nausea or vomiting, a fever, or increased pain in your abdomen. ?This information is not intended to replace advice given to you by your health care provider. Make sure you discuss any questions you have with your health care provider. ?Document Revised: 06/19/2019 Document Reviewed: 03/09/2019 ?Elsevier Patient Education ? [place]. ?Monitored Anesthesia Care, Care After ?This sheet gives you information about how to care for yourself after your procedure. Your health care provider may also give you more specific instructions. If you have problems or questions, contact your health care provider. ?What can I expect after the procedure? ?After the procedure, it is common to have: ?Tiredness. ?Forgetfulness about what happened after the procedure. ?Impaired judgment for important decisions. ?Nausea or vomiting. ?Some difficulty with balance. ?Follow these instructions at home: ?For the time period you were told by your health care provider: ?  ?Rest as needed. ?Do not participate in activities where you could fall or become injured. ?Do not drive or use machinery. ?Do not drink alcohol. ?Do not take sleeping pills or medicines that cause drowsiness. ?Do not make important decisions or sign legal documents. ?Do not take care of children on your own. ?Eating and drinking ?Follow the diet that is recommended by your health care provider. ?Drink enough fluid to keep your urine pale yellow. ?If you vomit: ?Drink water, juice, or soup when you can drink without vomiting. ?Make sure you  have little or no nausea before eating solid foods. ?General instructions ?Have a responsible adult stay with you for the time you are told. It is important to have someone help care for you until you are awake and alert. ?Take over-the-counter and prescription medicines only as told by your health care provider. ?If you have sleep apnea, surgery and certain medicines can increase your risk for breathing problems. Follow instructions from your health care provider about wearing your sleep device: ?Anytime you are sleeping, including during daytime naps. ?While taking prescription pain medicines, sleeping medicines, or medicines that make you drowsy. ?Avoid smoking. ?Keep all follow-up visits as told by your health care provider. This is important. ?Contact a health care provider if: ?You keep feeling nauseous or you keep vomiting. ?You feel light-headed. ?You are still sleepy or having trouble with balance after 24 hours. ?You develop a rash. ?You have a fever. ?You have redness or swelling around the IV site. ?Get help right away if: ?You have trouble breathing. ?You have new-onset confusion at home. ?Summary ?For several  hours after your procedure, you may feel tired. You may also be forgetful and have poor judgment. ?Have a responsible adult stay with you for the time you are told. It is important to have someone help care for you until you are awake and alert. ?Rest as told. Do not drive or operate machinery. Do not drink alcohol or take sleeping pills. ?Get help right away if you have trouble breathing, or if you suddenly become confused. ?This information is not intended to replace advice given to you by your health care provider. Make sure you discuss any questions you have with your health care provider. ?Document Revised: 04/28/2020 Document Reviewed: 07/16/2019 ?Elsevier Patient Education ? [place]. ? ?

## 2021-11-29 ENCOUNTER — Encounter (HOSPITAL_COMMUNITY): Payer: Self-pay

## 2021-11-29 ENCOUNTER — Encounter (HOSPITAL_COMMUNITY)
Admission: RE | Admit: 2021-11-29 | Discharge: 2021-11-29 | Disposition: A | Discharge status: Home / Self Care | Payer: Medicare HMO | Source: Ambulatory Visit | Attending: Internal Medicine | Admitting: Internal Medicine

## 2021-11-29 HISTORY — DX: Unspecified osteoarthritis, unspecified site: M19.90

## 2021-12-04 ENCOUNTER — Ambulatory Visit (HOSPITAL_COMMUNITY): Payer: Medicare HMO | Admitting: Anesthesiology

## 2021-12-04 ENCOUNTER — Ambulatory Visit (HOSPITAL_COMMUNITY)
Admission: RE | Admit: 2021-12-04 | Discharge: 2021-12-04 | Disposition: A | Discharge status: Home / Self Care | Payer: Medicare HMO | Attending: Internal Medicine | Admitting: Internal Medicine

## 2021-12-04 ENCOUNTER — Encounter (HOSPITAL_COMMUNITY)
Admission: RE | Disposition: A | Discharge status: Home / Self Care | Payer: Self-pay | Source: Home / Self Care | Attending: Internal Medicine

## 2021-12-04 ENCOUNTER — Ambulatory Visit (HOSPITAL_BASED_OUTPATIENT_CLINIC_OR_DEPARTMENT_OTHER): Payer: Medicare HMO | Admitting: Anesthesiology

## 2021-12-04 DIAGNOSIS — Z7984 Long term (current) use of oral hypoglycemic drugs: Secondary | ICD-10-CM | POA: Diagnosis not present

## 2021-12-04 DIAGNOSIS — Z79890 Hormone replacement therapy (postmenopausal): Secondary | ICD-10-CM | POA: Diagnosis not present

## 2021-12-04 DIAGNOSIS — E119 Type 2 diabetes mellitus without complications: Secondary | ICD-10-CM | POA: Diagnosis not present

## 2021-12-04 DIAGNOSIS — Z79899 Other long term (current) drug therapy: Secondary | ICD-10-CM | POA: Insufficient documentation

## 2021-12-04 DIAGNOSIS — Z1211 Encounter for screening for malignant neoplasm of colon: Secondary | ICD-10-CM | POA: Diagnosis not present

## 2021-12-04 DIAGNOSIS — G4700 Insomnia, unspecified: Secondary | ICD-10-CM | POA: Diagnosis not present

## 2021-12-04 DIAGNOSIS — K621 Rectal polyp: Secondary | ICD-10-CM

## 2021-12-04 DIAGNOSIS — E039 Hypothyroidism, unspecified: Secondary | ICD-10-CM | POA: Diagnosis not present

## 2021-12-04 DIAGNOSIS — F17210 Nicotine dependence, cigarettes, uncomplicated: Secondary | ICD-10-CM | POA: Insufficient documentation

## 2021-12-04 DIAGNOSIS — K5730 Diverticulosis of large intestine without perforation or abscess without bleeding: Secondary | ICD-10-CM | POA: Insufficient documentation

## 2021-12-04 DIAGNOSIS — R195 Other fecal abnormalities: Secondary | ICD-10-CM | POA: Insufficient documentation

## 2021-12-04 HISTORY — PX: BIOPSY: SHX5522

## 2021-12-04 HISTORY — PX: COLONOSCOPY WITH PROPOFOL: SHX5780

## 2021-12-04 LAB — GLUCOSE, CAPILLARY: Glucose-Capillary: 104 mg/dL — ABNORMAL HIGH (ref 70–99)

## 2021-12-04 SURGERY — COLONOSCOPY WITH PROPOFOL
Anesthesia: General

## 2021-12-04 MED ORDER — PROPOFOL 10 MG/ML IV BOLUS
INTRAVENOUS | Status: DC | PRN
  Administered 2021-12-04: 50 mg via INTRAVENOUS

## 2021-12-04 MED ORDER — LACTATED RINGERS IV SOLN: INTRAVENOUS | Status: DC

## 2021-12-04 MED ORDER — LIDOCAINE HCL (CARDIAC) PF 100 MG/5ML IV SOSY
PREFILLED_SYRINGE | INTRAVENOUS | Status: DC | PRN
  Administered 2021-12-04: 50 mg via INTRAVENOUS

## 2021-12-04 MED ORDER — PROPOFOL 500 MG/50ML IV EMUL
INTRAVENOUS | Status: DC | PRN
  Administered 2021-12-04: 150 ug/kg/min via INTRAVENOUS

## 2021-12-04 NOTE — Anesthesia Preprocedure Evaluation (Signed)
Anesthesia Evaluation  ?Patient identified by MRN, date of birth, ID band ?Patient awake ? ? ? ?Reviewed: ?Allergy & Precautions, NPO status , Patient's Chart, lab work & pertinent test results ? ?Airway ?Mallampati: II ? ?TM Distance: >3 FB ?Neck ROM: Full ? ? ? Dental ? ?(+) Dental Advisory Given, Upper Dentures ?  ?Pulmonary ?Current Smoker and Patient abstained from smoking.,  ?  ?Pulmonary exam normal ?breath sounds clear to auscultation ? ? ? ? ? ? Cardiovascular ?negative cardio ROS ?Normal cardiovascular exam ?Rhythm:Regular Rate:Normal ? ? ?  ?Neuro/Psych ?negative neurological ROS ? negative psych ROS  ? GI/Hepatic ?Neg liver ROS, Bowel prep,  ?Endo/Other  ?diabetes, Well Controlled, Type 2, Oral Hypoglycemic AgentsHypothyroidism  ? Renal/GU ?negative Renal ROS  ?negative genitourinary ?  ?Musculoskeletal ? ?(+) Arthritis , Osteoarthritis,   ? Abdominal ?  ?Peds ?negative pediatric ROS ?(+)  Hematology ?negative hematology ROS ?(+)   ?Anesthesia Other Findings ? ? Reproductive/Obstetrics ?negative OB ROS ? ?  ? ? ? ? ? ? ? ? ? ? ? ? ? ?  ?  ? ? ? ? ? ? ? ? ?Anesthesia Physical ?Anesthesia Plan ? ?ASA: 2 ? ?Anesthesia Plan: General  ? ?Post-op Pain Management: Minimal or no pain anticipated  ? ?Induction: Intravenous ? ?PONV Risk Score and Plan: TIVA ? ?Airway Management Planned: Nasal Cannula and Natural Airway ? ?Additional Equipment:  ? ?Intra-op Plan:  ? ?Post-operative Plan:  ? ?Informed Consent: I have reviewed the patients History and Physical, chart, labs and discussed the procedure including the risks, benefits and alternatives for the proposed anesthesia with the patient or authorized representative who has indicated his/her understanding and acceptance.  ? ? ? ?Dental advisory given ? ?Plan Discussed with: CRNA and Surgeon ? ?Anesthesia Plan Comments:   ? ? ? ? ? ? ?Anesthesia Quick Evaluation ? ?

## 2021-12-04 NOTE — Transfer of Care (Signed)
Immediate Anesthesia Transfer of Care Note ? ?Patient: Debra Burnett ? ?Procedure(s) Performed: COLONOSCOPY WITH PROPOFOL ?BIOPSY ? ?Patient Location: PACU ? ?Anesthesia Type:General ? ?Level of Consciousness: awake, alert , oriented and patient cooperative ? ?Airway & Oxygen Therapy: Patient Spontanous Breathing ? ?Post-op Assessment: Report given to RN, Post -op Vital signs reviewed and stable and Patient moving all extremities X 4 ? ?Post vital signs: Reviewed and stable ? ?Last Vitals:  ?Vitals Value Taken Time  ?BP    ?Temp    ?Pulse    ?Resp    ?SpO2    ? ? ?Last Pain:  ?Vitals:  ? 12/04/21 0951  ?TempSrc:   ?PainSc: 0-No pain  ?   ? ?Patients Stated Pain Goal: 6 (12/04/21 0900) ? ?Complications: No notable events documented. ?

## 2021-12-04 NOTE — Interval H&P Note (Signed)
History and Physical Interval Note: ? ?12/04/2021 ?9:40 AM ? ?Debra Burnett  has presented today for surgery, with the diagnosis of positive cologuard.  The various methods of treatment have been discussed with the patient and family. After consideration of risks, benefits and other options for treatment, the patient has consented to  Procedure(s) with comments: ?COLONOSCOPY WITH PROPOFOL (N/A) - 9:45am as a surgical intervention.  The patient's history has been reviewed, patient examined, no change in status, stable for surgery.  I have reviewed the patient's chart and labs.  Questions were answered to the patient's satisfaction.   ? ? ?Debra Burnett Debra Burnett ? ?Patient seen and examined in short stay.  No change.  Diagnostic colonoscopy today per plan. ? ?The risks, benefits, limitations, alternatives and imponderables have been reviewed with the patient. Questions have been answered. All parties are agreeable.   ?

## 2021-12-04 NOTE — Discharge Instructions (Addendum)
?  Colonoscopy ?Discharge Instructions ? ?Read the instructions outlined below and refer to this sheet in the next few weeks. These discharge instructions provide you with general information on caring for yourself after you leave the hospital. Your doctor may also give you specific instructions. While your treatment has been planned according to the most current medical practices available, unavoidable complications occasionally occur. If you have any problems or questions after discharge, call Dr. Jena Gauss at [phone]. ?ACTIVITY ?You may resume your regular activity, but move at a slower pace for the next 24 hours.  ?Take frequent rest periods for the next 24 hours.  ?Walking will help get rid of the air and reduce the bloated feeling in your belly (abdomen).  ?No driving for 24 hours (because of the medicine (anesthesia) used during the test).   ?Do not sign any important legal documents or operate any machinery for 24 hours (because of the anesthesia used during the test).  ?NUTRITION ?Drink plenty of fluids.  ?You may resume your normal diet as instructed by your doctor.  ?Begin with a light meal and progress to your normal diet. Heavy or fried foods are harder to digest and may make you feel sick to your stomach (nauseated).  ?Avoid alcoholic beverages for 24 hours or as instructed.  ?MEDICATIONS ?You may resume your normal medications unless your doctor tells you otherwise.  ?WHAT YOU CAN EXPECT TODAY ?Some feelings of bloating in the abdomen.  ?Passage of more gas than usual.  ?Spotting of blood in your stool or on the toilet paper.  ?IF YOU HAD POLYPS REMOVED DURING THE COLONOSCOPY: ?No aspirin products for 7 days or as instructed.  ?No alcohol for 7 days or as instructed.  ?Eat a soft diet for the next 24 hours.  ?FINDING OUT THE RESULTS OF YOUR TEST ?Not all test results are available during your visit. If your test results are not back during the visit, make an appointment with your caregiver to find out the  results. Do not assume everything is normal if you have not heard from your caregiver or the medical facility. It is important for you to follow up on all of your test results.  ?SEEK IMMEDIATE MEDICAL ATTENTION IF: ?You have more than a spotting of blood in your stool.  ?Your belly is swollen (abdominal distention).  ?You are nauseated or vomiting.  ?You have a temperature over 101.  ?You have abdominal pain or discomfort that is severe or gets worse throughout the day.   ? ?1 small polyp removed in your colon today ? ?Colon polyp and diverticulosis information provided ? ?Further recommendations to follow pending review of pathology report ? ?At patient request, I called Onalee Hua at [phone] -reviewed findings and recommendations ?

## 2021-12-04 NOTE — Op Note (Signed)
[facility] ?Patient Name: Debra Burnett ?Procedure Date: 12/04/2021 9:36 AM ?MRN: [id number] ?Date of Birth: 26-Jan-1951 ?Attending MD: Norvel Richards , MD ?CSN: [id number] ?Age: 71 ?Admit Type: Outpatient ?Procedure:                Colonoscopy ?Indications:              Positive Cologuard test ?Providers:                Norvel Richards, MD, Hughie Closs RN, RN,  ?                          Aram Candela ?Referring MD:              ?Medicines:                Propofol per Anesthesia ?Complications:            No immediate complications. ?Estimated Blood Loss:     Estimated blood loss was minimal. ?Procedure:                Pre-Anesthesia Assessment: ?                          - Prior to the procedure, a History and Physical  ?                          was performed, and patient medications and  ?                          allergies were reviewed. The patient's tolerance of  ?                          previous anesthesia was also reviewed. The risks  ?                          and benefits of the procedure and the sedation  ?                          options and risks were discussed with the patient.  ?                          All questions were answered, and informed consent  ?                          was obtained. Prior Anticoagulants: The patient has  ?                          taken no previous anticoagulant or antiplatelet  ?                          agents. ASA Grade Assessment: III - A patient with  ?                          severe systemic disease. After reviewing the risks  ?  and benefits, the patient was deemed in  ?                          satisfactory condition to undergo the procedure. ?                          After obtaining informed consent, the colonoscope  ?                          was passed under direct vision. Throughout the  ?                          procedure, the patient's blood pressure, pulse, and  ?                          oxygen saturations were  monitored continuously. The  ?                          [phone]) scope was introduced through  ?                          the anus and advanced to the the cecum, identified  ?                          by appendiceal orifice and ileocecal valve. The  ?                          colonoscopy was performed without difficulty. The  ?                          patient tolerated the procedure well. The quality  ?                          of the bowel preparation was adequate. ?Scope In: 9:56:36 AM ?Scope Out: 10:15:41 AM ?Scope Withdrawal Time: 0 hours 13 minutes 17 seconds  ?Total Procedure Duration: 0 hours 19 minutes 5 seconds  ?Findings: ?     The perianal and digital rectal examinations were normal. ?     Scattered small and large-mouthed diverticula were found in the entire  ?     colon. Single 2 mm polyp in the rectum at 10 cm of the anal verge. Polyp  ?     was cold biopsied/removed. ?     The exam was otherwise without abnormality on direct and retroflexion  ?     views. ?Impression:               - Diverticulosis in the entire examined colon.  ?                          Single diminutive rectal polyp status post cold  ?                          biopsy removal ?                          - The examination  was otherwise normal on direct  ?                          and retroflexion views. ?Moderate Sedation: ?     Moderate (conscious) sedation was personally administered by an  ?     anesthesia professional. The following parameters were monitored: oxygen  ?     saturation, heart rate, blood pressure, respiratory rate, EKG, adequacy  ?     of pulmonary ventilation, and response to care. ?Recommendation:           - Patient has a contact number available for  ?                          emergencies. The signs and symptoms of potential  ?                          delayed complications were discussed with the  ?                          patient. Return to normal activities tomorrow.  ?                           Written discharge instructions were provided to the  ?                          patient. ?                          - Advance diet as tolerated. ?                          - Continue present medications. ?                          - Repeat colonoscopy date to be determined after  ?                          pending pathology results are reviewed for  ?                          surveillance based on pathology results. ?                          - Return to GI office (date not yet determined). ?Procedure Code(s):        --- Professional --- ?                          [phone], Colonoscopy, flexible; diagnostic, including  ?                          collection of specimen(s) by brushing or washing,  ?                          when performed (separate procedure) ?Diagnosis Code(s):        --- Professional --- ?  R19.5, Other fecal abnormalities ?                          K57.30, Diverticulosis of large intestine without  ?                          perforation or abscess without bleeding ?CPT copyright 2019 American Medical Association. All rights reserved. ?The codes documented in this report are preliminary and upon coder review may  ?be revised to meet current compliance requirements. ?Cristopher Estimable. Lejla Moeser, MD ?Norvel Richards, MD ?12/04/2021 10:26:25 AM ?This report has been signed electronically. ?Number of Addenda: 0 ?

## 2021-12-04 NOTE — Anesthesia Postprocedure Evaluation (Signed)
Anesthesia Post Note ? ?Patient: Debra Burnett ? ?Procedure(s) Performed: COLONOSCOPY WITH PROPOFOL ?BIOPSY ? ?Patient location during evaluation: Phase II ?Anesthesia Type: General ?Level of consciousness: awake and alert and oriented ?Pain management: pain level controlled ?Vital Signs Assessment: post-procedure vital signs reviewed and stable ?Respiratory status: spontaneous breathing, nonlabored ventilation and respiratory function stable ?Cardiovascular status: blood pressure returned to baseline and stable ?Postop Assessment: no apparent nausea or vomiting ?Anesthetic complications: no ? ? ?No notable events documented. ? ? ?Last Vitals:  ?Vitals:  ? 12/04/21 1019 12/04/21 1021  ?BP: (!) 97/40 (!) 114/50  ?Pulse: 70   ?Resp: 14   ?Temp: 36.6 ?C   ?SpO2: 99%   ?  ?Last Pain:  ?Vitals:  ? 12/04/21 1019  ?TempSrc: Oral  ?PainSc: 0-No pain  ? ? ?  ?  ?  ?  ?  ?  ? ?Miranda Frese C Danijela Vessey ? ? ? ? ?

## 2021-12-05 LAB — SURGICAL PATHOLOGY

## 2021-12-06 ENCOUNTER — Encounter: Payer: Self-pay | Admitting: Internal Medicine

## 2021-12-07 ENCOUNTER — Encounter (HOSPITAL_COMMUNITY): Payer: Self-pay | Admitting: Internal Medicine

## 2021-12-27 ENCOUNTER — Encounter: Payer: Self-pay | Admitting: Internal Medicine

## 2022-04-02 DIAGNOSIS — F5102 Adjustment insomnia: Secondary | ICD-10-CM | POA: Diagnosis not present

## 2022-04-02 DIAGNOSIS — E039 Hypothyroidism, unspecified: Secondary | ICD-10-CM | POA: Diagnosis not present

## 2022-04-02 DIAGNOSIS — E119 Type 2 diabetes mellitus without complications: Secondary | ICD-10-CM | POA: Diagnosis not present

## 2022-04-02 DIAGNOSIS — F419 Anxiety disorder, unspecified: Secondary | ICD-10-CM | POA: Diagnosis not present

## 2022-04-02 DIAGNOSIS — E2839 Other primary ovarian failure: Secondary | ICD-10-CM | POA: Diagnosis not present

## 2022-04-02 DIAGNOSIS — Z720 Tobacco use: Secondary | ICD-10-CM | POA: Diagnosis not present

## 2022-04-02 DIAGNOSIS — E7800 Pure hypercholesterolemia, unspecified: Secondary | ICD-10-CM | POA: Diagnosis not present

## 2022-04-02 DIAGNOSIS — Z23 Encounter for immunization: Secondary | ICD-10-CM | POA: Diagnosis not present

## 2022-04-03 ENCOUNTER — Other Ambulatory Visit: Payer: Self-pay | Admitting: Physician Assistant

## 2022-04-03 DIAGNOSIS — E2839 Other primary ovarian failure: Secondary | ICD-10-CM

## 2022-05-15 DIAGNOSIS — R8290 Unspecified abnormal findings in urine: Secondary | ICD-10-CM | POA: Diagnosis not present

## 2022-05-15 DIAGNOSIS — R350 Frequency of micturition: Secondary | ICD-10-CM | POA: Diagnosis not present

## 2022-05-15 DIAGNOSIS — N3001 Acute cystitis with hematuria: Secondary | ICD-10-CM | POA: Diagnosis not present

## 2022-08-14 DIAGNOSIS — E119 Type 2 diabetes mellitus without complications: Secondary | ICD-10-CM | POA: Diagnosis not present

## 2022-08-14 DIAGNOSIS — E039 Hypothyroidism, unspecified: Secondary | ICD-10-CM | POA: Diagnosis not present

## 2022-08-14 DIAGNOSIS — R7989 Other specified abnormal findings of blood chemistry: Secondary | ICD-10-CM | POA: Diagnosis not present

## 2022-08-14 DIAGNOSIS — F5102 Adjustment insomnia: Secondary | ICD-10-CM | POA: Diagnosis not present

## 2022-08-14 DIAGNOSIS — Z23 Encounter for immunization: Secondary | ICD-10-CM | POA: Diagnosis not present

## 2022-08-14 DIAGNOSIS — Z0000 Encounter for general adult medical examination without abnormal findings: Secondary | ICD-10-CM | POA: Diagnosis not present

## 2022-08-14 DIAGNOSIS — R030 Elevated blood-pressure reading, without diagnosis of hypertension: Secondary | ICD-10-CM | POA: Diagnosis not present

## 2022-08-14 DIAGNOSIS — Z720 Tobacco use: Secondary | ICD-10-CM | POA: Diagnosis not present

## 2022-08-14 DIAGNOSIS — E7800 Pure hypercholesterolemia, unspecified: Secondary | ICD-10-CM | POA: Diagnosis not present

## 2022-08-14 DIAGNOSIS — F419 Anxiety disorder, unspecified: Secondary | ICD-10-CM | POA: Diagnosis not present

## 2022-09-17 ENCOUNTER — Other Ambulatory Visit (HOSPITAL_COMMUNITY): Payer: Self-pay | Admitting: Physician Assistant

## 2022-09-17 DIAGNOSIS — E2839 Other primary ovarian failure: Secondary | ICD-10-CM

## 2022-09-20 ENCOUNTER — Other Ambulatory Visit: Payer: Medicare HMO

## 2022-10-01 ENCOUNTER — Ambulatory Visit (HOSPITAL_COMMUNITY)
Admission: RE | Admit: 2022-10-01 | Discharge: 2022-10-01 | Disposition: A | Discharge status: Home / Self Care | Payer: HMO | Source: Ambulatory Visit | Attending: Physician Assistant | Admitting: Physician Assistant

## 2022-10-01 DIAGNOSIS — E2839 Other primary ovarian failure: Secondary | ICD-10-CM | POA: Insufficient documentation

## 2024-03-30 ENCOUNTER — Other Ambulatory Visit (HOSPITAL_COMMUNITY): Payer: Self-pay | Admitting: Nurse Practitioner

## 2024-03-30 DIAGNOSIS — F17200 Nicotine dependence, unspecified, uncomplicated: Secondary | ICD-10-CM

## 2024-04-16 ENCOUNTER — Ambulatory Visit (HOSPITAL_COMMUNITY)
Admission: RE | Admit: 2024-04-16 | Discharge: 2024-04-16 | Disposition: A | Discharge status: Home / Self Care | Source: Ambulatory Visit | Attending: Nurse Practitioner | Admitting: Nurse Practitioner

## 2024-04-16 DIAGNOSIS — F17200 Nicotine dependence, unspecified, uncomplicated: Secondary | ICD-10-CM

## 2024-04-16 DIAGNOSIS — I700 Atherosclerosis of aorta: Secondary | ICD-10-CM | POA: Diagnosis not present

## 2024-04-16 DIAGNOSIS — Z87891 Personal history of nicotine dependence: Secondary | ICD-10-CM | POA: Diagnosis not present

## 2024-04-16 DIAGNOSIS — I2510 Atherosclerotic heart disease of native coronary artery without angina pectoris: Secondary | ICD-10-CM | POA: Diagnosis not present

## 2024-04-16 DIAGNOSIS — Z122 Encounter for screening for malignant neoplasm of respiratory organs: Secondary | ICD-10-CM | POA: Insufficient documentation

## 2024-04-16 DIAGNOSIS — J439 Emphysema, unspecified: Secondary | ICD-10-CM | POA: Diagnosis not present
